# Patient Record
Sex: Female | Born: 2000 | Race: Black or African American | Hispanic: No | Marital: Single | Smoking: Never smoker
Health system: Southern US, Community
[De-identification: ages and names within clinical notes are randomized; demographics above are authoritative.]

## PROBLEM LIST (undated history)

## (undated) DIAGNOSIS — F419 Anxiety disorder, unspecified: Secondary | ICD-10-CM

## (undated) DIAGNOSIS — F909 Attention-deficit hyperactivity disorder, unspecified type: Secondary | ICD-10-CM

## (undated) HISTORY — DX: Anxiety disorder, unspecified: F41.9

---
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
## 2021-01-21 IMAGING — US US OB COMP LESS 14 WK
1 series · 14 of 23 positions shown · non-contrast
Comparison: None.

CLINICAL DATA: Abdominal pain and bleeding

EXAM:
OBSTETRIC <14 WK ULTRASOUND
TECHNIQUE: Transabdominal ultrasound was performed for evaluation of the
gestation as well as the maternal uterus and adnexal regions.

[Series 1: us ob comp less 14 wk · 14 of 23 slices shown]
[im 1/23]
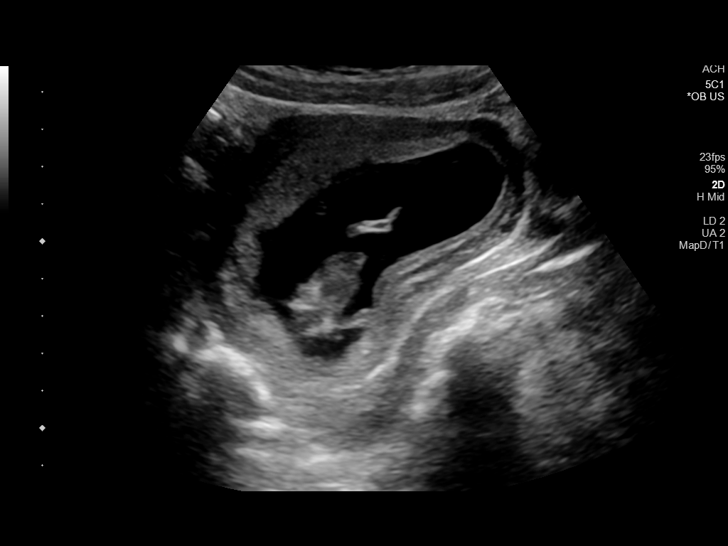
[im 3/23]
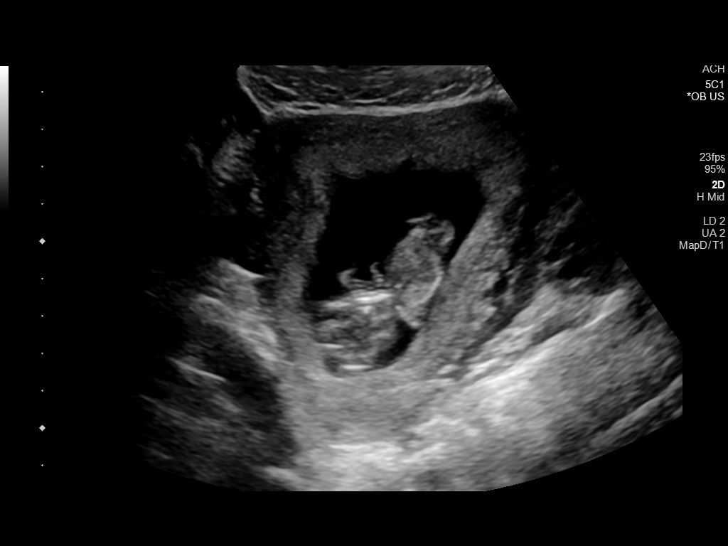
[im 5/23]
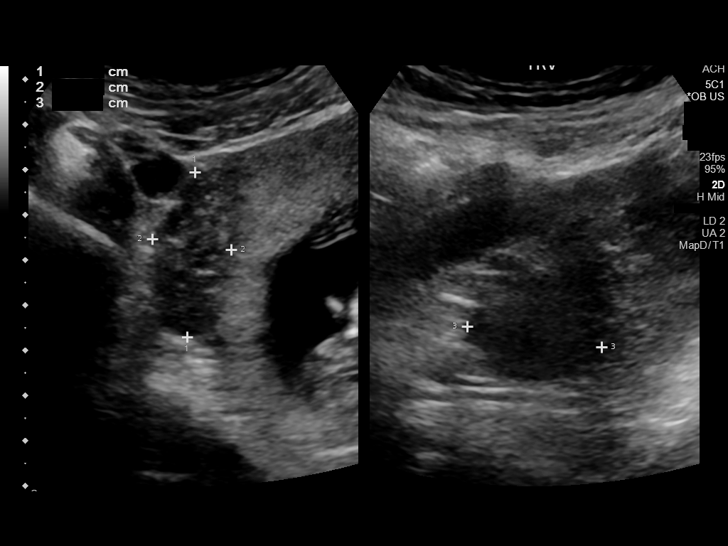
[im 6/23]
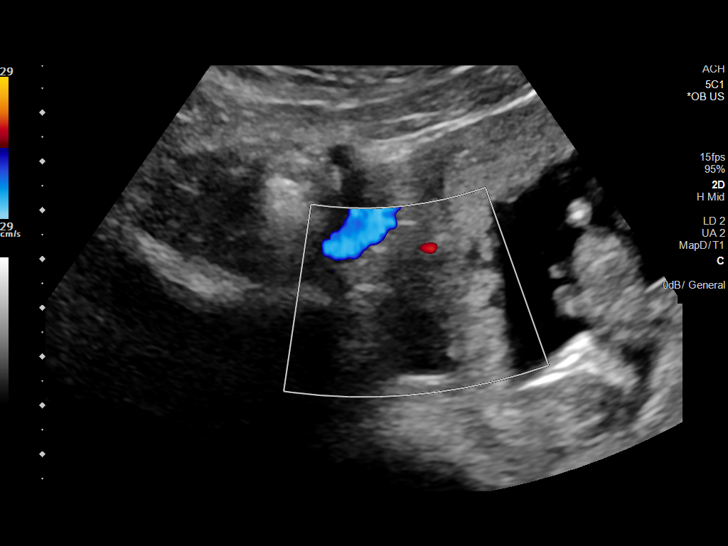
[im 8/23]
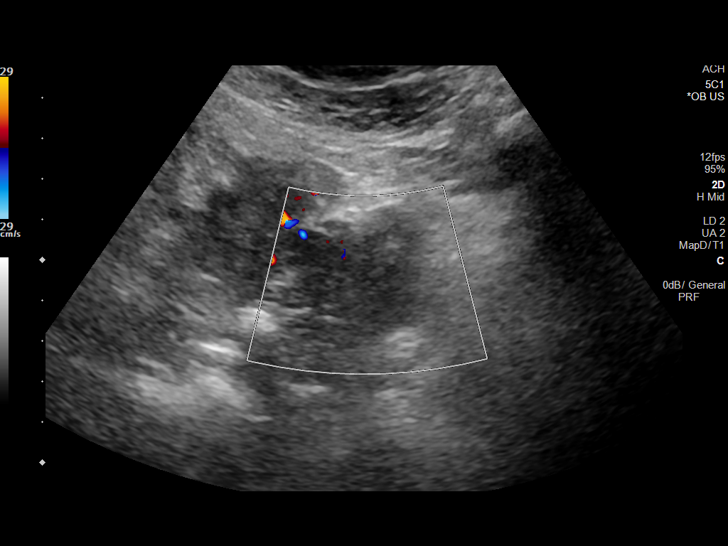
[im 10/23]
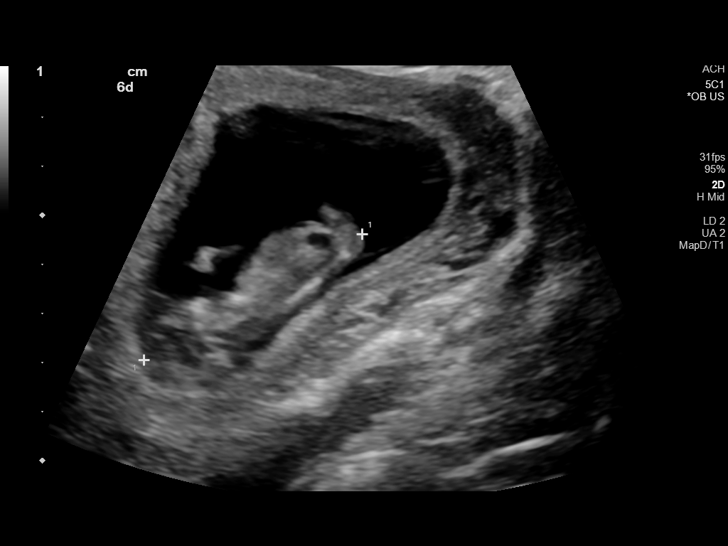
[im 11/23]
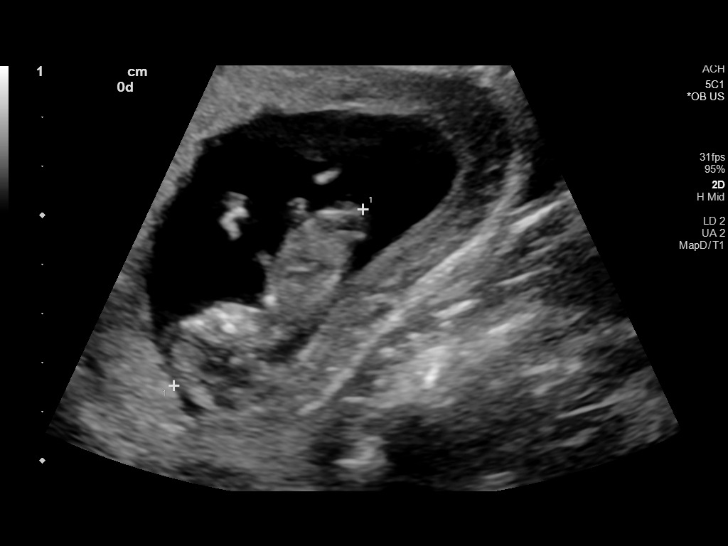
[im 13/23]
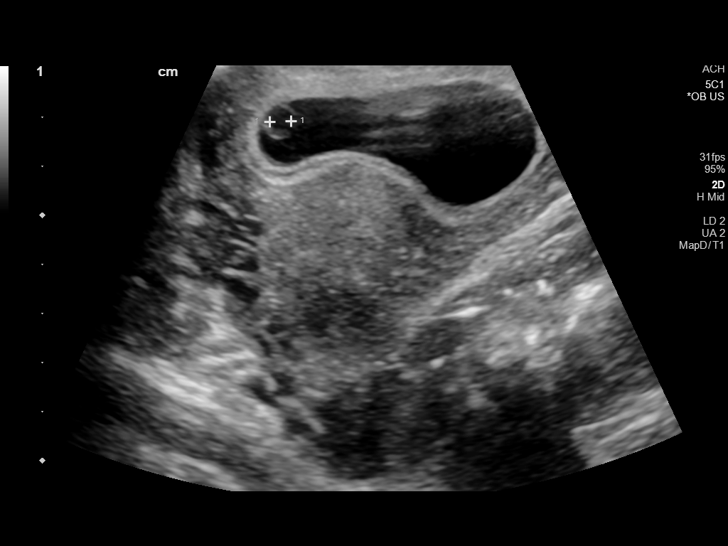
[im 14/23]
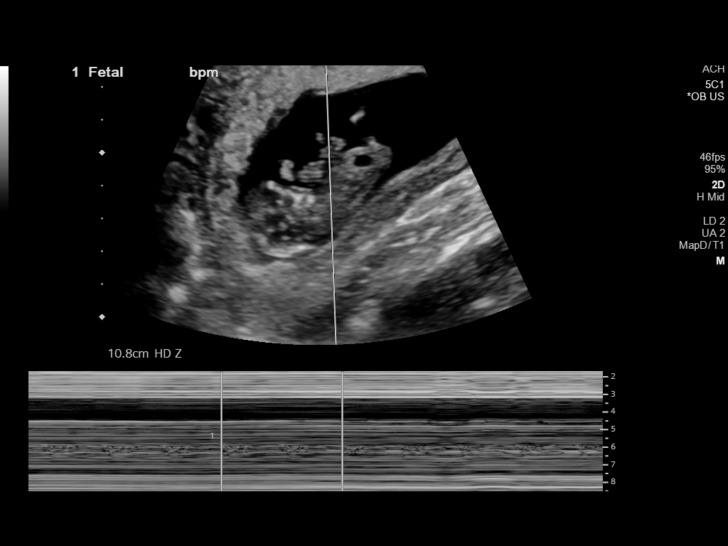
[im 16/23]
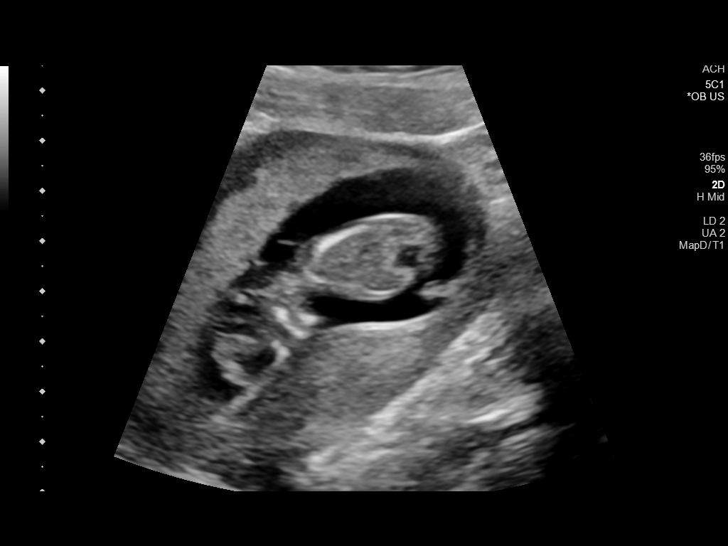
[im 18/23]
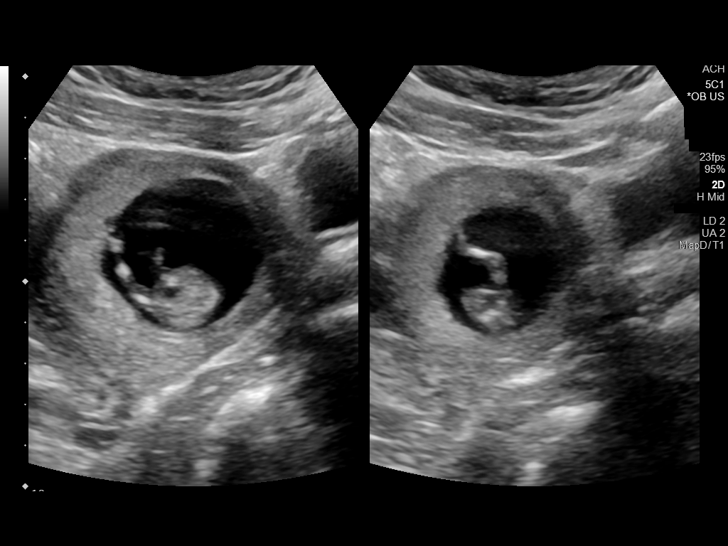
[im 19/23]
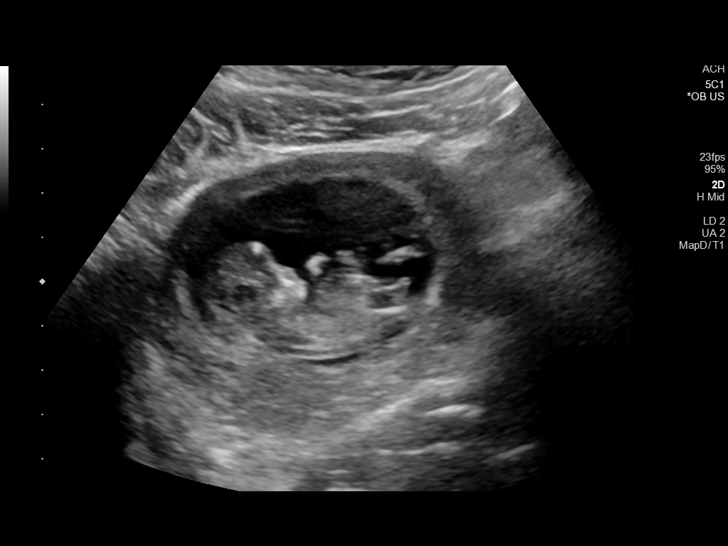
[im 21/23]
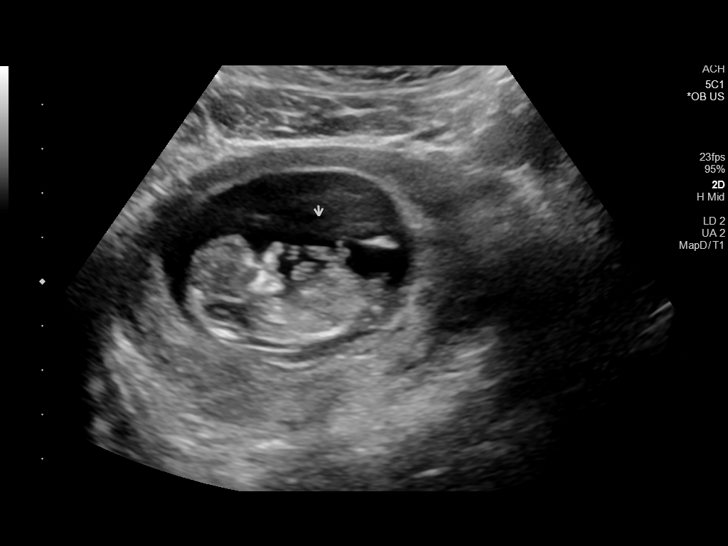
[im 23/23]
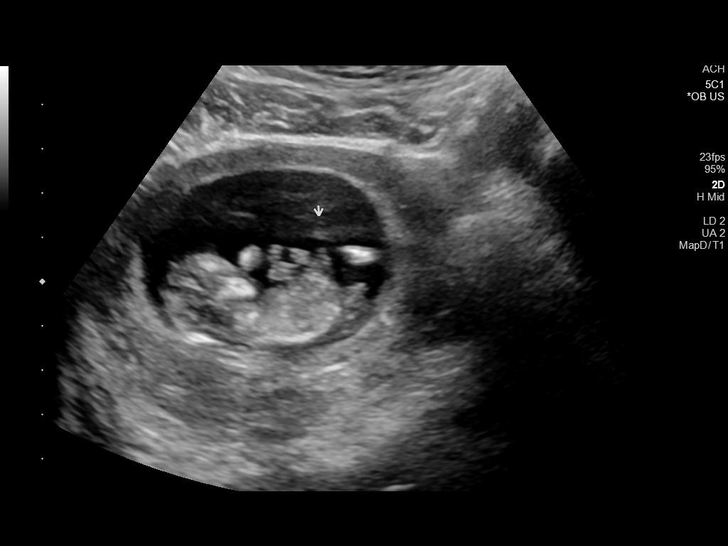

[14 of 23 positions shown; findings below may reference images not displayed]

FINDINGS: Intrauterine gestational sac: Single

Yolk sac:  Visualized.

Embryo:  Visualized.

Cardiac Activity: Visualized.

Heart Rate: 169 bpm

Midgut herniation is noted, a normal finding at this gestational
age.

CRL: 5.27 mm   12 w 0 d                  US EDC: 12/01/2019

Subchorionic hemorrhage:  None visualized.

Maternal uterus/adnexae: No maternal abnormality is detected. There
is no significant free fluid in the patient's pelvis.
IMPRESSION: Single live IUP at 12 weeks and 0 days as detailed above.

## 2021-02-11 IMAGING — DX DG ABD PORTABLE 1V
1 series · 1 of 1 positions shown · non-contrast
Comparison: None.

CLINICAL DATA: Feeding tube placement

EXAM:
PORTABLE ABDOMEN - 1 VIEW

[abdomen]
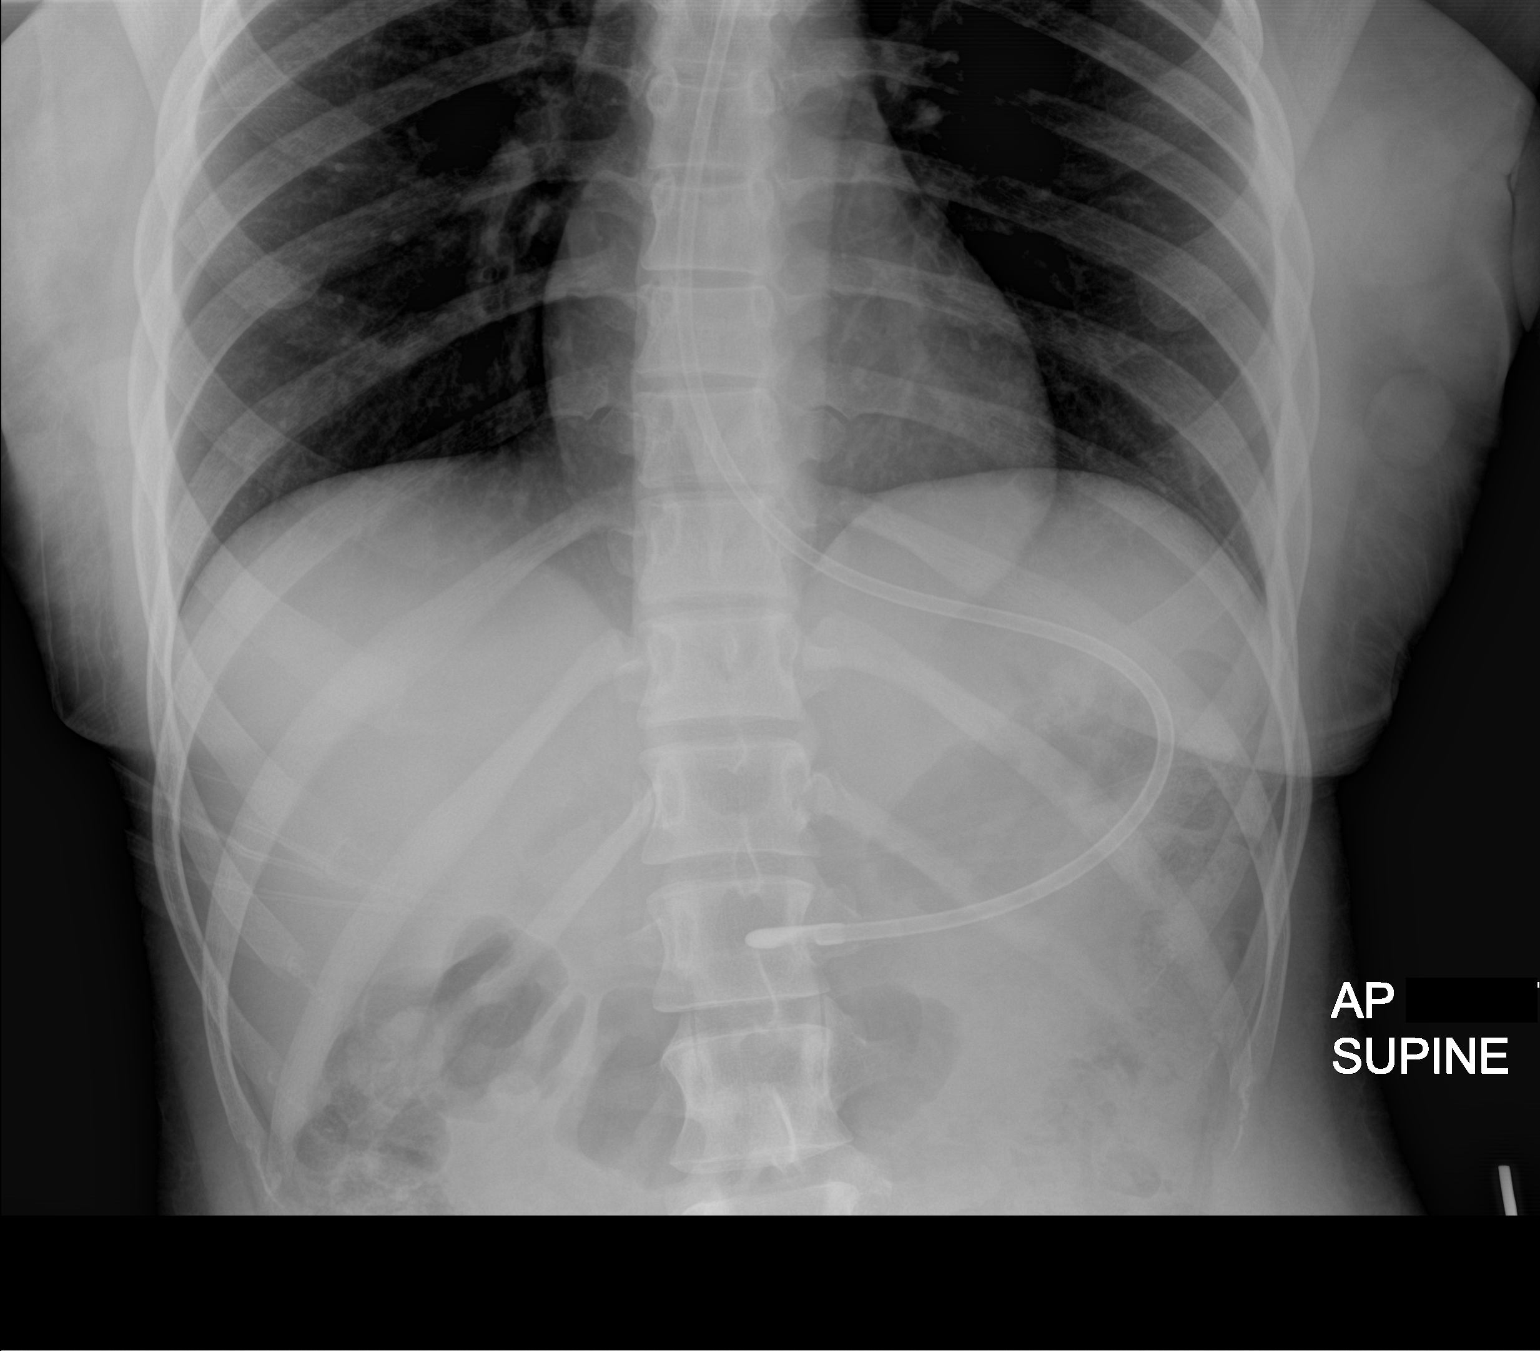

[1 of 1 positions shown; findings below may reference images not displayed]

FINDINGS: Feeding tube is in the distal stomach. Nonobstructive bowel gas
pattern.
IMPRESSION: Feeding tube tip in the distal stomach.

## 2022-06-10 DIAGNOSIS — N946 Dysmenorrhea, unspecified: Secondary | ICD-10-CM | POA: Diagnosis not present

## 2022-06-10 DIAGNOSIS — R55 Syncope and collapse: Secondary | ICD-10-CM | POA: Diagnosis not present

## 2022-06-16 DIAGNOSIS — D649 Anemia, unspecified: Secondary | ICD-10-CM | POA: Diagnosis not present

## 2022-06-16 DIAGNOSIS — R55 Syncope and collapse: Secondary | ICD-10-CM | POA: Diagnosis not present

## 2022-06-16 DIAGNOSIS — F439 Reaction to severe stress, unspecified: Secondary | ICD-10-CM | POA: Diagnosis not present

## 2022-07-31 DIAGNOSIS — R55 Syncope and collapse: Secondary | ICD-10-CM | POA: Diagnosis not present

## 2022-08-12 DIAGNOSIS — Z113 Encounter for screening for infections with a predominantly sexual mode of transmission: Secondary | ICD-10-CM | POA: Diagnosis not present

## 2022-08-12 DIAGNOSIS — N898 Other specified noninflammatory disorders of vagina: Secondary | ICD-10-CM | POA: Diagnosis not present

## 2022-08-17 DIAGNOSIS — R55 Syncope and collapse: Secondary | ICD-10-CM | POA: Diagnosis not present

## 2022-08-28 DIAGNOSIS — N760 Acute vaginitis: Secondary | ICD-10-CM | POA: Diagnosis not present

## 2022-09-11 DIAGNOSIS — N898 Other specified noninflammatory disorders of vagina: Secondary | ICD-10-CM | POA: Diagnosis not present

## 2022-11-20 DIAGNOSIS — R102 Pelvic and perineal pain: Secondary | ICD-10-CM | POA: Diagnosis not present

## 2022-11-20 DIAGNOSIS — N923 Ovulation bleeding: Secondary | ICD-10-CM | POA: Diagnosis not present

## 2022-11-20 DIAGNOSIS — N898 Other specified noninflammatory disorders of vagina: Secondary | ICD-10-CM | POA: Diagnosis not present

## 2022-11-20 DIAGNOSIS — Z113 Encounter for screening for infections with a predominantly sexual mode of transmission: Secondary | ICD-10-CM | POA: Diagnosis not present

## 2023-01-01 DIAGNOSIS — Z113 Encounter for screening for infections with a predominantly sexual mode of transmission: Secondary | ICD-10-CM | POA: Diagnosis not present

## 2023-01-01 DIAGNOSIS — R350 Frequency of micturition: Secondary | ICD-10-CM | POA: Diagnosis not present

## 2023-01-01 DIAGNOSIS — N898 Other specified noninflammatory disorders of vagina: Secondary | ICD-10-CM | POA: Diagnosis not present

## 2023-01-01 DIAGNOSIS — N939 Abnormal uterine and vaginal bleeding, unspecified: Secondary | ICD-10-CM | POA: Diagnosis not present

## 2023-01-27 DIAGNOSIS — N946 Dysmenorrhea, unspecified: Secondary | ICD-10-CM | POA: Diagnosis not present

## 2023-01-27 DIAGNOSIS — N939 Abnormal uterine and vaginal bleeding, unspecified: Secondary | ICD-10-CM | POA: Diagnosis not present

## 2023-02-25 DIAGNOSIS — R1032 Left lower quadrant pain: Secondary | ICD-10-CM | POA: Diagnosis not present

## 2023-02-25 DIAGNOSIS — Z23 Encounter for immunization: Secondary | ICD-10-CM | POA: Diagnosis not present

## 2023-04-13 ENCOUNTER — Other Ambulatory Visit (HOSPITAL_COMMUNITY)
Admission: RE | Admit: 2023-04-13 | Discharge: 2023-04-13 | Disposition: A | Discharge status: Home / Self Care | Payer: BC Managed Care – PPO | Source: Ambulatory Visit | Attending: Nurse Practitioner | Admitting: Nurse Practitioner

## 2023-04-13 ENCOUNTER — Other Ambulatory Visit: Payer: Self-pay | Admitting: Nurse Practitioner

## 2023-04-13 DIAGNOSIS — Z01419 Encounter for gynecological examination (general) (routine) without abnormal findings: Secondary | ICD-10-CM | POA: Diagnosis not present

## 2023-04-13 DIAGNOSIS — Z124 Encounter for screening for malignant neoplasm of cervix: Secondary | ICD-10-CM | POA: Insufficient documentation

## 2023-04-13 DIAGNOSIS — N921 Excessive and frequent menstruation with irregular cycle: Secondary | ICD-10-CM | POA: Diagnosis not present

## 2023-04-13 DIAGNOSIS — N6489 Other specified disorders of breast: Secondary | ICD-10-CM | POA: Diagnosis not present

## 2023-04-15 LAB — CYTOLOGY - PAP
Diagnosis: NEGATIVE
Diagnosis: REACTIVE

## 2023-05-05 DIAGNOSIS — K5909 Other constipation: Secondary | ICD-10-CM | POA: Diagnosis not present

## 2023-05-05 DIAGNOSIS — R1084 Generalized abdominal pain: Secondary | ICD-10-CM | POA: Diagnosis not present

## 2023-05-06 DIAGNOSIS — N921 Excessive and frequent menstruation with irregular cycle: Secondary | ICD-10-CM | POA: Diagnosis not present

## 2023-05-19 ENCOUNTER — Other Ambulatory Visit: Payer: Self-pay | Admitting: Family Medicine

## 2023-05-19 ENCOUNTER — Ambulatory Visit
Admission: RE | Admit: 2023-05-19 | Discharge: 2023-05-19 | Disposition: A | Discharge status: Home / Self Care | Payer: BC Managed Care – PPO | Source: Ambulatory Visit | Attending: Family Medicine | Admitting: Family Medicine

## 2023-05-19 DIAGNOSIS — R1032 Left lower quadrant pain: Secondary | ICD-10-CM | POA: Diagnosis not present

## 2023-05-19 DIAGNOSIS — R1084 Generalized abdominal pain: Secondary | ICD-10-CM

## 2023-05-19 DIAGNOSIS — K5900 Constipation, unspecified: Secondary | ICD-10-CM | POA: Diagnosis not present

## 2023-06-25 DIAGNOSIS — L853 Xerosis cutis: Secondary | ICD-10-CM | POA: Diagnosis not present

## 2023-07-29 DIAGNOSIS — Z3041 Encounter for surveillance of contraceptive pills: Secondary | ICD-10-CM | POA: Diagnosis not present

## 2023-07-29 DIAGNOSIS — N946 Dysmenorrhea, unspecified: Secondary | ICD-10-CM | POA: Diagnosis not present

## 2023-08-23 DIAGNOSIS — R051 Acute cough: Secondary | ICD-10-CM | POA: Diagnosis not present

## 2023-08-23 DIAGNOSIS — R0981 Nasal congestion: Secondary | ICD-10-CM | POA: Diagnosis not present

## 2023-08-23 DIAGNOSIS — H6501 Acute serous otitis media, right ear: Secondary | ICD-10-CM | POA: Diagnosis not present

## 2023-10-15 DIAGNOSIS — J0110 Acute frontal sinusitis, unspecified: Secondary | ICD-10-CM | POA: Diagnosis not present

## 2024-02-25 DIAGNOSIS — Z113 Encounter for screening for infections with a predominantly sexual mode of transmission: Secondary | ICD-10-CM | POA: Diagnosis not present

## 2024-02-25 DIAGNOSIS — N898 Other specified noninflammatory disorders of vagina: Secondary | ICD-10-CM | POA: Diagnosis not present

## 2024-03-10 DIAGNOSIS — N3944 Nocturnal enuresis: Secondary | ICD-10-CM | POA: Diagnosis not present

## 2024-03-10 DIAGNOSIS — N3281 Overactive bladder: Secondary | ICD-10-CM | POA: Diagnosis not present

## 2024-03-10 DIAGNOSIS — R350 Frequency of micturition: Secondary | ICD-10-CM | POA: Diagnosis not present

## 2024-03-10 DIAGNOSIS — R351 Nocturia: Secondary | ICD-10-CM | POA: Diagnosis not present

## 2024-04-05 ENCOUNTER — Ambulatory Visit (HOSPITAL_COMMUNITY): Admission: EM | Admit: 2024-04-05 | Discharge: 2024-04-05 | Disposition: A | Discharge status: Home / Self Care

## 2024-04-05 ENCOUNTER — Encounter (HOSPITAL_COMMUNITY): Payer: Self-pay

## 2024-04-05 DIAGNOSIS — Z3202 Encounter for pregnancy test, result negative: Secondary | ICD-10-CM | POA: Diagnosis not present

## 2024-04-05 DIAGNOSIS — N3001 Acute cystitis with hematuria: Secondary | ICD-10-CM | POA: Diagnosis present

## 2024-04-05 DIAGNOSIS — N760 Acute vaginitis: Secondary | ICD-10-CM | POA: Insufficient documentation

## 2024-04-05 LAB — POCT URINE DIPSTICK
Glucose, UA: NEGATIVE mg/dL
Nitrite, UA: NEGATIVE
POC PROTEIN,UA: 100 — AB
Spec Grav, UA: >=1.03 — AB (ref 1.010–1.025)
Urobilinogen, UA: 2 U/dL — AB
pH, UA: 7 (ref 5.0–8.0)

## 2024-04-05 LAB — POCT URINE PREGNANCY: Preg Test, Ur: NEGATIVE

## 2024-04-05 MED ORDER — SULFAMETHOXAZOLE-TRIMETHOPRIM 800-160 MG PO TABS: 1.0000 | ORAL_TABLET | Freq: Two times a day (BID) | ORAL | 0 refills | Status: AC

## 2024-04-05 MED ORDER — PHENAZOPYRIDINE HCL 200 MG PO TABS: 200.0000 mg | ORAL_TABLET | Freq: Three times a day (TID) | ORAL | 0 refills | Status: AC

## 2024-04-05 NOTE — ED Triage Notes (Signed)
 Patient here today with c/o vaginal discharge, vaginal discomfort, and burning in urination X 5 days.

## 2024-04-05 NOTE — Discharge Instructions (Signed)
°  1. Acute cystitis with hematuria (Primary) - POC Urinalysis Dipstick completed in UC is positive for large leukocytes, small blood, no nitrite, these findings are prominently indicative of urinary infection. - POCT urine pregnancy completed in UC is negative. - sulfamethoxazole-trimethoprim (BACTRIM DS) 800-160 MG tablet; Take 1 tablet by mouth 2 (two) times daily for 5 days.  Dispense: 10 tablet; Refill: 0 - phenazopyridine (PYRIDIUM) 200 MG tablet; Take 1 tablet (200 mg total) by mouth 3 (three) times daily.  Dispense: 6 tablet; Refill: 0 - Urine Culture collected in UC and sent to lab for further testing results should be available in 2 to 3 days  2. Acute vaginitis - Cervicovaginal swab collected in UC and sent to lab for further testing results should be available in 2 to 3 days.  -Continue to monitor symptoms for any change in severity if there is any escalation of current symptoms or development of new symptoms follow-up in ER for further evaluation and management.

## 2024-04-05 NOTE — ED Provider Notes (Signed)
 [facility]  Note:  This document was prepared using Conservation officer, historic buildings and may include unintentional dictation errors.  MRN: [id number] DOB: 07-08-2000  Subjective:   Karla Wright is a 23 y.o. female presenting for evaluation of vaginal discharge, vaginal irritation,, burning with urination x 4 to 5 days.  Patient denies any fever, increased urinary frequency, severe abdominal pain, flank pain, pelvic pain.  Patient has previous history of trichomonas infection, bacterial vaginosis, chlamydia infection in the past.  Patient denies any known exposure to STD or other secondary infections.  No shortness of breath, chest pain, weakness, dizziness, fever.  No current facility-administered medications for this encounter.  Current Outpatient Medications:    phenazopyridine (PYRIDIUM) 200 MG tablet, Take 1 tablet (200 mg total) by mouth 3 (three) times daily., Disp: 6 tablet, Rfl: 0   sulfamethoxazole-trimethoprim (BACTRIM DS) 800-160 MG tablet, Take 1 tablet by mouth 2 (two) times daily for 5 days., Disp: 10 tablet, Rfl: 0   Blood Pressure Monitoring (BLOOD PRESSURE KIT) DEVI, 1 kit by Does not apply route as needed., Disp: 1 each, Rfl: 0   Blood Pressure Monitoring (BLOOD PRESSURE KIT) DEVI, 1 kit by Does not apply route once a week. Check Blood Pressure regularly and record readings into the [employer] App.  Large Cuff.  DX O90.0, Disp: 1 each, Rfl: 0   ibuprofen  (ADVIL ) 600 MG tablet, Take 1 tablet (600 mg total) by mouth every 6 (six) hours., Disp: 30 tablet, Rfl: 0   metoCLOPramide  (REGLAN ) 10 MG tablet, Take 1 tablet (10 mg total) by mouth every 6 (six) hours as needed for nausea or vomiting. (Patient not taking: Reported on 11/25/2019), Disp: 30 tablet, Rfl: 1   norgestimate-ethinyl estradiol (ORTHO-CYCLEN) 0.25-35 MG-MCG tablet, Take 1 tablet by mouth daily., Disp: , Rfl:    tolterodine (DETROL LA) 4 MG 24 hr capsule, Take 4 mg by mouth daily., Disp: ,  Rfl:    Allergies  Allergen Reactions   Banana Anaphylaxis    Past Medical History:  Diagnosis Date   ADHD    Anxiety      History reviewed. No pertinent surgical history.  Family History  Problem Relation Age of Onset   ADD / ADHD Father    Arthritis Father    Diabetes Father    Hypertension Father    Learning disabilities Father    Diabetes Paternal Grandfather     Social History   Tobacco Use   Smoking status: Never   Smokeless tobacco: Never  Vaping Use   Vaping status: Never Used  Substance Use Topics   Alcohol use: Yes    Comment: socially   Drug use: Yes    Types: Marijuana    ROS Refer to HPI for ROS details.  Objective:    Vitals: BP 127/79 (BP Location: Left Arm)   Pulse 70   Temp 98.8 F (37.1 C) (Oral)   Resp 16   LMP 03/11/2024 (Approximate)   SpO2 99%   Breastfeeding No   Physical Exam Vitals and nursing note reviewed.  Constitutional:      General: She is not in acute distress.    Appearance: Normal appearance. She is well-developed. She is not ill-appearing or toxic-appearing.  HENT:     Head: Normocephalic and atraumatic.  Cardiovascular:     Rate and Rhythm: Normal rate.  Pulmonary:     Effort: Pulmonary effort is normal. No respiratory distress.     Breath sounds: No stridor. No wheezing.  Abdominal:  Palpations: Abdomen is soft.     Tenderness: There is no abdominal tenderness. There is no right CVA tenderness or left CVA tenderness.  Genitourinary:    Vagina: Vaginal discharge and tenderness present. No bleeding.  Skin:    General: Skin is warm and dry.  Neurological:     General: No focal deficit present.     Mental Status: She is alert and oriented to person, place, and time.  Psychiatric:        Mood and Affect: Mood normal.        Behavior: Behavior normal.     Procedures  Results for orders placed or performed during the hospital encounter of 04/05/24 (from the past 24 hours)  POC Urinalysis Dipstick      Status: Abnormal   Collection Time: 04/05/24  9:30 AM  Result Value Ref Range   Color, UA yellow yellow   Clarity, UA cloudy (A) clear   Glucose, UA negative negative mg/dL   Bilirubin, UA small (A) negative   Ketones, POC UA moderate (40) (A) negative mg/dL   Spec Grav, UA >=8.969 (A) 1.010 - 1.025   Blood, UA small (A) negative   pH, UA 7.0 5.0 - 8.0   POC PROTEIN,UA =100 (A) negative, trace   Urobilinogen, UA 2.0 (A) 0.2 or 1.0 E.U./dL   Nitrite, UA Negative Negative   Leukocytes, UA Large (3+) (A) Negative  POCT urine pregnancy     Status: None   Collection Time: 04/05/24  9:30 AM  Result Value Ref Range   Preg Test, Ur Negative Negative    Assessment and Plan :     Discharge Instructions       1. Acute cystitis with hematuria (Primary) - POC Urinalysis Dipstick completed in UC is positive for large leukocytes, small blood, no nitrite, these findings are prominently indicative of urinary infection. - POCT urine pregnancy completed in UC is negative. - sulfamethoxazole-trimethoprim (BACTRIM DS) 800-160 MG tablet; Take 1 tablet by mouth 2 (two) times daily for 5 days.  Dispense: 10 tablet; Refill: 0 - phenazopyridine (PYRIDIUM) 200 MG tablet; Take 1 tablet (200 mg total) by mouth 3 (three) times daily.  Dispense: 6 tablet; Refill: 0 - Urine Culture collected in UC and sent to lab for further testing results should be available in 2 to 3 days  2. Acute vaginitis - Cervicovaginal swab collected in UC and sent to lab for further testing results should be available in 2 to 3 days.  -Continue to monitor symptoms for any change in severity if there is any escalation of current symptoms or development of new symptoms follow-up in ER for further evaluation and management.      Ubah Radke B Josiel Gahm   Jermichael Belmares, [place] B, NP 04/05/24 1010

## 2024-04-06 ENCOUNTER — Ambulatory Visit (HOSPITAL_COMMUNITY): Payer: Self-pay

## 2024-04-06 LAB — CERVICOVAGINAL ANCILLARY ONLY
Bacterial Vaginitis (gardnerella): NEGATIVE
Candida Glabrata: NEGATIVE
Candida Vaginitis: POSITIVE — AB
Chlamydia: NEGATIVE
Comment: NEGATIVE
Comment: NEGATIVE
Comment: NEGATIVE
Comment: NEGATIVE
Comment: NEGATIVE
Comment: NORMAL
Neisseria Gonorrhea: NEGATIVE
Trichomonas: NEGATIVE

## 2024-04-06 MED ORDER — FLUCONAZOLE 150 MG PO TABS: 150.0000 mg | ORAL_TABLET | Freq: Once | ORAL | 0 refills | Status: AC
